# Patient Record
Sex: Female | Born: 1975 | Race: White | Hispanic: No | Marital: Married | Smoking: Current every day smoker
Health system: Southern US, Community
[De-identification: ages and names within clinical notes are randomized; demographics above are authoritative.]

## PROBLEM LIST (undated history)

## (undated) DIAGNOSIS — N200 Calculus of kidney: Secondary | ICD-10-CM

---

## 1998-03-14 ENCOUNTER — Other Ambulatory Visit: Admission: RE | Admit: 1998-03-14 | Discharge: 1998-03-14 | Payer: Self-pay | Admitting: *Deleted

## 1998-08-17 ENCOUNTER — Inpatient Hospital Stay (HOSPITAL_COMMUNITY): Admission: AD | Admit: 1998-08-17 | Discharge: 1998-08-20 | Payer: Self-pay

## 2000-05-19 ENCOUNTER — Other Ambulatory Visit: Admission: RE | Admit: 2000-05-19 | Discharge: 2000-05-19 | Payer: Self-pay | Admitting: Obstetrics and Gynecology

## 2002-12-19 ENCOUNTER — Other Ambulatory Visit: Admission: RE | Admit: 2002-12-19 | Discharge: 2002-12-19 | Payer: Self-pay

## 2003-09-03 ENCOUNTER — Other Ambulatory Visit: Admission: RE | Admit: 2003-09-03 | Discharge: 2003-09-03 | Payer: Self-pay | Admitting: Obstetrics and Gynecology

## 2011-09-02 ENCOUNTER — Other Ambulatory Visit (HOSPITAL_COMMUNITY): Payer: Self-pay | Admitting: MS"

## 2011-09-02 DIAGNOSIS — Q792 Exomphalos: Secondary | ICD-10-CM

## 2011-09-04 ENCOUNTER — Ambulatory Visit (HOSPITAL_COMMUNITY)
Admission: RE | Admit: 2011-09-04 | Discharge: 2011-09-04 | Disposition: A | Discharge status: Home / Self Care | Payer: 59 | Source: Ambulatory Visit | Attending: Nurse Practitioner | Admitting: Nurse Practitioner

## 2011-09-04 ENCOUNTER — Encounter (HOSPITAL_COMMUNITY): Payer: Self-pay

## 2011-09-04 VITALS — BP 110/66 | HR 104 | Wt 155.0 lb

## 2011-09-04 DIAGNOSIS — Q792 Exomphalos: Secondary | ICD-10-CM

## 2011-09-04 DIAGNOSIS — O352XX0 Maternal care for (suspected) hereditary disease in fetus, not applicable or unspecified: Secondary | ICD-10-CM | POA: Insufficient documentation

## 2011-09-04 DIAGNOSIS — O09529 Supervision of elderly multigravida, unspecified trimester: Secondary | ICD-10-CM | POA: Insufficient documentation

## 2011-09-04 DIAGNOSIS — O34219 Maternal care for unspecified type scar from previous cesarean delivery: Secondary | ICD-10-CM | POA: Insufficient documentation

## 2011-09-04 DIAGNOSIS — O358XX0 Maternal care for other (suspected) fetal abnormality and damage, not applicable or unspecified: Secondary | ICD-10-CM | POA: Insufficient documentation

## 2011-09-04 NOTE — Progress Notes (Addendum)
Genetic Counseling  High-Risk Gestation Note  Appointment Date:  09/04/2011 Referred By: Jeneen Montgomery  MD Date of Birth:  1976/02/08 Partner:  Malachy Mood  Estimated Date of Delivery: 03/06/12 Estimated Gestational Age: [redacted]w[redacted]d Attending: Rema Fendt, MD  [name] and her husband, Mr. Jillian Ibarra, were seen for genetic counseling because of a maternal age of 36 y.o. and previous ultrasound finding of omphalocele.   Ultrasound was performed at the time of today's visit and confirmed the finding of a large omphalocele at [redacted]w[redacted]d gestation. Complete ultrasound results reported separately.   Omphalocele occurs in ~1 in every 4000 births (M1:F5) and is defined as the protrusion of abdominal viscera through the umbilical ring, covered by membrane.  This defect is thought to arise from failure of lateral body migration and body wall closure or from the embryonic persistence of the body stalk.  We discussed that ~two thirds of all cases have associated anomalies, most commonly including: cardiac defects, neural tube defects, and cleft lip with or without palate.  We reviewed the common causes of omphaloceles, including sporadic, multifactorial, and genetic etiologies.  Specifically, we discussed that omphaloceles are associated with a 30-50% risk of fetal aneuploidy, complexes such as OEIS, and genetic syndromes including Beckwith-Wiedemann syndrome (BWS) and single gene conditions.  There are reports of familial nonsyndromic omphaloceles with both autosomal recessive and autosomal dominant inheritance reported.    They were counseled regarding maternal age and the association with risk for chromosome conditions due to nondisjunction with aging of the ova.   We reviewed chromosomes and nondisjunction. We specifically discussed Down syndrome (trisomy 21), trisomies 13 and 51, including the common features and prognoses of each. We reviewed the diagnostic options of CVS and amniocetesis  for prenatal diagnosis of chromosome conditions.  We discussed the benefits, limitations, and risks of each. We discussed another type of screening test, noninvasive prenatal testing (NIPT), which utilizes cell free fetal DNA found in the maternal circulation. This test is not diagnostic for chromosome conditions, but can provide information regarding the presence or absence of extra fetal DNA for chromosomes 13, 18 and 21. Thus, it would not identify or rule out all fetal aneuploidy. The reported detection rate is greater than 99% for Trisomy 21, greater than 97% for Trisomy 18, and is approximately 80% (8 out of 10) for Trisomy 13. The false positive rate is thought to be less than 0.1% for any of these conditions. We discussed that second trimester targeted ultrasound is available to further assess fetal anatomy. The couple understands that ultrasound cannot detect all birth defects or genetic conditions prenatally. Additionally, a postnatal medical genetics evaluation would be available to help determine whether there is an underlying genetic syndrome.  After careful consideration, [name] declined CVS, amniocentesis, and Harm[facility] (cell free fetal DNA testing) at the time of today's visit. The couple is further considering pursuing Harmony, and would possibly consider amniocentesis pending the results of that testing. Targeted ultrasound was planned for 10/02/11. The couple stated that they may pursue Harmony (cell free fetal DNA testing) at the time of this return appointment.   We discussed management and prognosis.  They understand that the overall prognosis depends upon the underlying etiology; however, if isolated and nonsyndromic, the prognosis is relatively good.  We reviewed the option of meeting with a pediatric surgeon to review the surgical approach and postnatal management.  In addition, we also discussed the importance of delivering in tertiary care center, to optimize care  of the  newborn.  [name] stated that she would like to plan to delivery with [facility]. We discussed that later in the pregnancy she would be referred for a prenatal surgical consult.  We also discussed the recommendation for fetal echocardiogram, given the increased association of omphaloceles with CHDs, which will also be scheduled at a later date. We provided the couple with written information on omphalocele. They were encouraged to call with additional questions or concerns.   Both family histories were reviewed and found to be contributory for a ventricular septal defect (VSD) in the patient's son with a previous partner. His VSD was repaired at age 67 and [age] years. He is currently 37 years old and reportedly healthy. Congenital heart defects occur in approximately 1% of pregnancies.  Congenital heart defects may occur due to multifactorial influences, chromosomal abnormalities, genetic syndromes or environmental exposures.  Isolated heart defects are generally multifactorial. Recurrence risk for isolated congenital heart defects in second degree relatives in the case of multifactorial inheritance is approximately 1-2%. Targeted ultrasound and fetal echocardiogram are available to the patient in the current pregnancy as previously discussed.   Additionally, [name] reported a maternal uncle with Down syndrome who passed away at a few months of age. His mother reportedly drank alcohol throughout the pregnancy. We discussed that 95% of cases of Down syndrome are not inherited and are the result of non-disjunction.  Three to 4% of cases of Down syndrome are the result of a translocation involving chromosome #21.  We discussed the option of chromosome analysis to determine if an individual is a carrier of a balanced translocation involving chromosome #21.  If an individual carries a balanced translocation involving chromosome #21, then the chance to have a baby with Down syndrome would be  greater than the maternal age-related risk. In the case that this uncle's features were due to prenatal alcohol exposure and not Down syndrome, recurrence risk would not be expected to be increased for relatives. In the case of a different underlying cause, recurrence risk estimate may change.  Without further information regarding the provided family history, an accurate genetic risk cannot be calculated. Further genetic counseling is warranted if more information is obtained.  [name] denied exposure to environmental toxins or chemical agents. She denied the use of alcohol, tobacco or street drugs. She denied significant viral illnesses during the course of her pregnancy. Her medical and surgical histories were contributory for kidney stones and disproportionate kidney size. She reported that she sees a nephrologist monthly.    I counseled this couple regarding the above risks and available options.  The approximate face-to-face time with the genetic counselor was 30 minutes.  Quinn Plowman, MS,  Certified [employer] 09/04/2011

## 2011-09-04 NOTE — Progress Notes (Signed)
Jillian Ibarra was seen for ultrasound appointment today.  Please see [facility] report for details.

## 2011-09-07 ENCOUNTER — Other Ambulatory Visit: Payer: Self-pay

## 2011-10-02 ENCOUNTER — Ambulatory Visit (HOSPITAL_COMMUNITY)
Admission: RE | Admit: 2011-10-02 | Discharge: 2011-10-02 | Disposition: A | Discharge status: Home / Self Care | Payer: 59 | Source: Ambulatory Visit | Attending: Nurse Practitioner | Admitting: Nurse Practitioner

## 2011-10-02 ENCOUNTER — Encounter (HOSPITAL_COMMUNITY): Payer: Self-pay

## 2011-10-02 VITALS — BP 107/70 | HR 106 | Wt 156.0 lb

## 2011-10-02 DIAGNOSIS — O352XX0 Maternal care for (suspected) hereditary disease in fetus, not applicable or unspecified: Secondary | ICD-10-CM | POA: Insufficient documentation

## 2011-10-02 DIAGNOSIS — Z1389 Encounter for screening for other disorder: Secondary | ICD-10-CM | POA: Insufficient documentation

## 2011-10-02 DIAGNOSIS — O34219 Maternal care for unspecified type scar from previous cesarean delivery: Secondary | ICD-10-CM | POA: Insufficient documentation

## 2011-10-02 DIAGNOSIS — O358XX0 Maternal care for other (suspected) fetal abnormality and damage, not applicable or unspecified: Secondary | ICD-10-CM | POA: Insufficient documentation

## 2011-10-02 DIAGNOSIS — Q792 Exomphalos: Secondary | ICD-10-CM

## 2011-10-02 DIAGNOSIS — Z363 Encounter for antenatal screening for malformations: Secondary | ICD-10-CM | POA: Insufficient documentation

## 2011-10-02 DIAGNOSIS — O09529 Supervision of elderly multigravida, unspecified trimester: Secondary | ICD-10-CM | POA: Insufficient documentation

## 2011-10-05 ENCOUNTER — Telehealth (HOSPITAL_COMMUNITY): Payer: Self-pay | Admitting: MS"

## 2011-10-05 ENCOUNTER — Telehealth (HOSPITAL_COMMUNITY): Payer: Self-pay | Admitting: Maternal and Fetal Medicine

## 2011-10-05 NOTE — Telephone Encounter (Signed)
[name] called. Stated that she and her partner had discussed and considered options over the weekend and have decided to terminate the pregnancy given the multiple birth defects present. We reviewed the options of induction of pregnancy versus dilation and evacuation procedure. Jillian Ibarra stated that she would like to proceed with D&E and would like our practice to facilitate the procedure. Jillian Ibarra stated that she has medicaid and insurance and inquired about insurance coverage. I reviewed that Medicaid will not cover the procedure but that she may call her insurance company directly to inquire about coverage. We will contact patient when appointments are arranged, tentatively scheduled for next week.

## 2011-10-05 NOTE — Telephone Encounter (Signed)
Ms. Hendon has decided to proceed with termination of pregnancy due to the multiple fetal anomalies present.  We discussed the risk, and alternatives of termination of pregnancy via induction of labor and D&E.  She wishes to have a D&E.  The [place] termination of pregnancy information was given to her over the phone today at 12:00 pm.    She will report to [facility] at 10:30 am on Tuesday 10/13/11 for laminaria placement.  She pre-anesthesia visit and D&E procedure are scheduled for 10/14/11.  She will report at 9:00 am.

## 2011-10-14 ENCOUNTER — Other Ambulatory Visit: Payer: Self-pay

## 2011-10-29 ENCOUNTER — Telehealth (HOSPITAL_COMMUNITY): Payer: Self-pay | Admitting: MS"

## 2011-10-29 NOTE — Telephone Encounter (Signed)
Called Jillian Ibarra to discuss her Harmony, cell free fetal DNA testing.  We reviewed that a result was not able to be obtained from the sample. The underlying reason that a result was not obtained was reportedly due to high variation in cell free DNA counts. [name] stated that she is doing OK. She inquired if testing was performed on placenta at the time of her D&E. I discussed that I was unable to see if testing had been performed but that the records may be in the [facility] system. I plan to discuss with Dr. Otho Perl next week and call patient back.   Quinn Plowman, MS Patent attorney

## 2011-11-03 ENCOUNTER — Ambulatory Visit (HOSPITAL_COMMUNITY): Payer: 59

## 2011-11-05 ENCOUNTER — Telehealth (HOSPITAL_COMMUNITY): Payer: Self-pay | Admitting: MS"

## 2011-11-05 NOTE — Telephone Encounter (Signed)
Called 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 We reviewed the common features, including what was visualized on prenatal ultrasound in the patient's pregnancy and the poor prognosis of Trisomy 18. We reviewed nondisjunction and that Trisomy 18 occurs sporadically. Once a couple has had one pregnancy with Trisomy 31, the risk of any extra chromosome condition in a future pregnancy is the greatest of the following figures: either 1% or a woman's age-related risk for a chromosome condition. Given that [name] is currently 36 y.o., we reviewed that the risk for fetal aneuploidy in a future pregnancy would be the patient's age related risk, which will change over time. We reviewed that all screening and diagnostic options for fetal chromosome conditions that were available to the patient in her recent pregnancy would also be available to her in a future pregnancy including CVS, amniocentesis, cell free fetal DNA testing, and screening options. We reviewed that we would be available to review these options again prior to or during a future pregnancy.   We reviewed the options of bereavement support, including local resource group [employer], pregnancy and infant loss support. [name] stated that she was familiar with this organization.   Additionally, we discussed that Dr. Otho Perl had been contacted about completing forms for a request for disability. [name] stated that was regarding her request for FMLA following the D&E and that this was already resolved.   We encouraged the patient to contact us with additional questions or concerns.   Quinn Plowman, MS Certified Genetic Counselor 12:12 PM

## 2014-05-28 ENCOUNTER — Encounter (HOSPITAL_COMMUNITY): Payer: Self-pay

## 2018-11-08 ENCOUNTER — Encounter (HOSPITAL_COMMUNITY): Payer: Self-pay | Admitting: Emergency Medicine

## 2018-11-08 ENCOUNTER — Other Ambulatory Visit: Payer: Self-pay

## 2018-11-08 ENCOUNTER — Ambulatory Visit (HOSPITAL_COMMUNITY)
Admission: EM | Admit: 2018-11-08 | Discharge: 2018-11-08 | Disposition: A | Discharge status: Home / Self Care | Payer: Self-pay | Attending: Family Medicine | Admitting: Family Medicine

## 2018-11-08 DIAGNOSIS — N390 Urinary tract infection, site not specified: Secondary | ICD-10-CM

## 2018-11-08 HISTORY — DX: Calculus of kidney: N20.0

## 2018-11-08 LAB — POCT PREGNANCY, URINE: Preg Test, Ur: NEGATIVE

## 2018-11-08 MED ORDER — NITROFURANTOIN MONOHYD MACRO 100 MG PO CAPS: 100.0000 mg | ORAL_CAPSULE | Freq: Two times a day (BID) | ORAL | 0 refills | Status: AC

## 2018-11-08 NOTE — ED Notes (Signed)
UA ran specimen rejected due to quality.

## 2018-11-08 NOTE — ED Triage Notes (Signed)
Pt here for UTI sx x 4 days 

## 2018-11-08 NOTE — ED Provider Notes (Signed)
[facility]    CSN: [id number] Arrival date & time: 11/08/18  1024     History   Chief Complaint Chief Complaint  Patient presents with  . Urinary Tract Infection    HPI Jillian DailyJennifer R Wetherington is a 43 y.o. female  history of kidney stones presenting today for evaluation of possible UTI.  Patient states that over the past 4 days she has noticed that her urine has become cloudy, yellow and had an odor to it.  She is also developed some left lower back pain.  She denies dysuria, but does have increased frequency.  Has been trying to drink cranberry juice.  Denies fever, nausea or vomiting.  Denies vaginal discharge, itching or irritation.  HPI  Past Medical History:  Diagnosis Date  . Kidney stones     There are no active problems to display for this patient.   History reviewed. No pertinent surgical history.  OB History   No obstetric history on file.      Home Medications    Prior to Admission medications   Medication Sig Start Date End Date Taking? Authorizing Provider  nitrofurantoin, macrocrystal-monohydrate, (MACROBID) 100 MG capsule Take 1 capsule (100 mg total) by mouth 2 (two) times Ibarra for 5 days. 11/08/18 11/13/18  Chistian Kasler C, PA-C  PRENATAL VITAMINS PO Take by mouth.    [provider]    Family History Family History  Problem Relation Age of Onset  . Down syndrome Maternal Uncle   . Congenital heart disease Son        VSD; half-sibling to current pregnancy    Social History Social History   Tobacco Use  . Smoking status: Current Every Day Smoker  . Smokeless tobacco: Never Used  Substance Use Topics  . Alcohol use: Not Currently  . Drug use: Never     Allergies   Sulfa antibiotics   Review of Systems Review of Systems  Constitutional: Negative for fever.  Respiratory: Negative for shortness of breath.   Cardiovascular: Negative for chest pain.  Gastrointestinal: Negative for abdominal pain, diarrhea, nausea and  vomiting.  Genitourinary: Positive for frequency. Negative for dysuria, flank pain, genital sores, hematuria, menstrual problem, vaginal bleeding, vaginal discharge and vaginal pain.  Musculoskeletal: Positive for back pain.  Skin: Negative for rash.  Neurological: Negative for dizziness, light-headedness and headaches.     Physical Exam Triage Vital Signs ED Triage Vitals  Enc Vitals Group     BP 11/08/18 1048 100/71     Pulse Rate 11/08/18 1048 91     Resp 11/08/18 1048 18     Temp 11/08/18 1048 97.6 F (36.4 C)     Temp Source 11/08/18 1048 Oral     SpO2 11/08/18 1048 97 %     Weight --      Height --      Head Circumference --      Peak Flow --      Pain Score 11/08/18 1050 4     Pain Loc --      Pain Edu? --      Excl. in GC? --    No data found.  Updated Vital Signs BP 100/71 (BP Location: Right Arm)   Pulse 91   Temp 97.6 F (36.4 C) (Oral)   Resp 18   SpO2 97%   Breastfeeding Unknown   Visual Acuity Right Eye Distance:   Left Eye Distance:   Bilateral Distance:    Right Eye Near:   Left  Eye Near:    Bilateral Near:     Physical Exam Vitals signs and nursing note reviewed.  Constitutional:      General: She is not in acute distress.    Appearance: She is well-developed.  HENT:     Head: Normocephalic and atraumatic.  Eyes:     Conjunctiva/sclera: Conjunctivae normal.  Neck:     Musculoskeletal: Neck supple.  Cardiovascular:     Rate and Rhythm: Normal rate and regular rhythm.     Heart sounds: No murmur.  Pulmonary:     Effort: Pulmonary effort is normal. No respiratory distress.     Breath sounds: Normal breath sounds.     Comments: Breathing comfortably at rest, CTABL, no wheezing, rales or other adventitious sounds auscultated Abdominal:     Palpations: Abdomen is soft.     Tenderness: There is abdominal tenderness.     Comments: Mild tenderness to upper abdomen, no focal tenderness, negative rebound, nontender to bilateral lower  quadrants No CVA tenderness  Skin:    General: Skin is warm and dry.  Neurological:     Mental Status: She is alert.      UC Treatments / Results  Labs (all labs ordered are listed, but only abnormal results are displayed) Labs Reviewed  URINE CULTURE  POC URINE PREG, ED  POCT PREGNANCY, URINE    EKG None  Radiology No results found.  Procedures Procedures (including critical care time)  Medications Ordered in UC Medications - No data to display  Initial Impression / Assessment and Plan / UC Course  I have reviewed the triage vital signs and the nursing notes.  Pertinent labs & imaging results that were available during my care of the patient were reviewed by me and considered in my medical decision making (see chart for details).     UA unable to be run by machine in clinic, will send for culture.  Pregnancy negative.  Will treat symptomatically based off symptoms for UTI today, initiating on Macrobid twice Ibarra x5 days.  Discussed cannot rule out kidney stone today, symptoms seem more UTI related but advised to continue to monitor pain in lower back and take anti-inflammatories to help with this in case underlying stone.  Follow-up if symptoms worsening or not resolving with antibiotic.Discussed strict return precautions. Patient verbalized understanding and is agreeable with plan.  Final Clinical Impressions(s) / UC Diagnoses   Final diagnoses:  Lower urinary tract infectious disease     Discharge Instructions     Please begin taking Macrobid twice Ibarra for the next 5 days. be sure to take full course. Stay hydrated- urine should be pale yellow to clear. May continue azo for relief of burning while infection is being cleared.   Please return or follow up with your primary provider if symptoms not improving with treatment. Please return sooner if you have worsening of symptoms or develop fever, nausea, vomiting, abdominal pain, back pain, lightheadedness,  dizziness.    ED Prescriptions    Medication Sig Dispense Auth. Provider   nitrofurantoin, macrocrystal-monohydrate, (MACROBID) 100 MG capsule Take 1 capsule (100 mg total) by mouth 2 (two) times Ibarra for 5 days. 10 capsule Lorilyn Laitinen C, PA-C     Controlled Substance Prescriptions [place] Controlled Substance Registry consulted? Not Applicable   Lew Dawes, [place] 11/08/18 1157

## 2018-11-08 NOTE — Discharge Instructions (Signed)
Please begin taking Macrobid twice daily for the next 5 days. be sure to take full course. Stay hydrated- urine should be pale yellow to clear. May continue azo for relief of burning while infection is being cleared.   Please return or follow up with your primary provider if symptoms not improving with treatment. Please return sooner if you have worsening of symptoms or develop fever, nausea, vomiting, abdominal pain, back pain, lightheadedness, dizziness.

## 2018-11-11 ENCOUNTER — Telehealth (HOSPITAL_COMMUNITY): Payer: Self-pay | Admitting: Emergency Medicine

## 2018-11-11 LAB — URINE CULTURE: Culture: >=100000 — AB

## 2018-11-11 NOTE — Telephone Encounter (Signed)
Attempted to reach patient. No answer at this time.   

## 2018-12-20 ENCOUNTER — Emergency Department (HOSPITAL_COMMUNITY)
Admission: EM | Admit: 2018-12-20 | Discharge: 2018-12-20 | Disposition: A | Discharge status: Home / Self Care | Payer: Medicaid Other | Attending: Emergency Medicine | Admitting: Emergency Medicine

## 2018-12-20 DIAGNOSIS — R112 Nausea with vomiting, unspecified: Secondary | ICD-10-CM | POA: Diagnosis present

## 2018-12-20 DIAGNOSIS — N12 Tubulo-interstitial nephritis, not specified as acute or chronic: Secondary | ICD-10-CM | POA: Diagnosis not present

## 2018-12-20 DIAGNOSIS — F17200 Nicotine dependence, unspecified, uncomplicated: Secondary | ICD-10-CM | POA: Insufficient documentation

## 2018-12-20 DIAGNOSIS — Z79899 Other long term (current) drug therapy: Secondary | ICD-10-CM | POA: Insufficient documentation

## 2018-12-20 LAB — COMPREHENSIVE METABOLIC PANEL
ALT: 20 U/L (ref 0–44)
AST: 19 U/L (ref 15–41)
Albumin: 3.1 g/dL — ABNORMAL LOW (ref 3.5–5.0)
Alkaline Phosphatase: 90 U/L (ref 38–126)
Anion gap: 10 (ref 5–15)
BUN: 15 mg/dL (ref 6–20)
CO2: 21 mmol/L — ABNORMAL LOW (ref 22–32)
Calcium: 8.8 mg/dL — ABNORMAL LOW (ref 8.9–10.3)
Chloride: 104 mmol/L (ref 98–111)
Creatinine, Ser: 0.9 mg/dL (ref 0.44–1.00)
GFR calc Af Amer: >60 mL/min (ref >60)
GFR calc non Af Amer: >60 mL/min (ref >60)
Glucose, Bld: 183 mg/dL — ABNORMAL HIGH (ref 70–99)
Potassium: 3.5 mmol/L (ref 3.5–5.1)
Sodium: 135 mmol/L (ref 135–145)
Total Bilirubin: 1.1 mg/dL (ref 0.3–1.2)
Total Protein: 7.6 g/dL (ref 6.5–8.1)

## 2018-12-20 LAB — CBC
HCT: 31.5 % — ABNORMAL LOW (ref 36.0–46.0)
Hemoglobin: 10.6 g/dL — ABNORMAL LOW (ref 12.0–15.0)
MCH: 29.9 pg (ref 26.0–34.0)
MCHC: 33.7 g/dL (ref 30.0–36.0)
MCV: 89 fL (ref 80.0–100.0)
Platelets: 351 10*3/uL (ref 150–400)
RBC: 3.54 MIL/uL — ABNORMAL LOW (ref 3.87–5.11)
RDW: 13.9 % (ref 11.5–15.5)
WBC: 20.5 10*3/uL — ABNORMAL HIGH (ref 4.0–10.5)
nRBC: 0 % (ref 0.0–0.2)

## 2018-12-20 LAB — URINALYSIS, ROUTINE W REFLEX MICROSCOPIC
Bilirubin Urine: NEGATIVE
Glucose, UA: NEGATIVE mg/dL
Ketones, ur: 5 mg/dL — AB
Nitrite: NEGATIVE
Protein, ur: 100 mg/dL — AB
Specific Gravity, Urine: 1.014 (ref 1.005–1.030)
WBC, UA: >50 WBC/hpf — ABNORMAL HIGH (ref 0–5)
pH: 6 (ref 5.0–8.0)

## 2018-12-20 LAB — I-STAT BETA HCG BLOOD, ED (MC, WL, AP ONLY): I-stat hCG, quantitative: 10.9 m[IU]/mL — ABNORMAL HIGH (ref <5)

## 2018-12-20 LAB — LIPASE, BLOOD: Lipase: 19 U/L (ref 11–51)

## 2018-12-20 LAB — HCG, QUANTITATIVE, PREGNANCY: hCG, Beta Chain, Quant, S: 6 m[IU]/mL — ABNORMAL HIGH (ref <5)

## 2018-12-20 MED ORDER — ONDANSETRON 8 MG PO TBDP: 8.0000 mg | ORAL_TABLET | Freq: Three times a day (TID) | ORAL | 0 refills | Status: AC | PRN

## 2018-12-20 MED ORDER — SODIUM CHLORIDE 0.9 % IV BOLUS
1000.0000 mL | Freq: Once | INTRAVENOUS | Status: AC
  Administered 2018-12-20: 1000 mL via INTRAVENOUS

## 2018-12-20 MED ORDER — HYDROCODONE-ACETAMINOPHEN 5-325 MG PO TABS: 1.0000 | ORAL_TABLET | ORAL | 0 refills | Status: AC | PRN

## 2018-12-20 MED ORDER — ONDANSETRON HCL 4 MG/2ML IJ SOLN
4.0000 mg | Freq: Once | INTRAMUSCULAR | Status: AC
  Administered 2018-12-20: 4 mg via INTRAVENOUS
  Filled 2018-12-20: qty 2

## 2018-12-20 MED ORDER — ONDANSETRON 8 MG PO TBDP: 8.0000 mg | ORAL_TABLET | Freq: Three times a day (TID) | ORAL | 0 refills | Status: DC | PRN

## 2018-12-20 MED ORDER — METOCLOPRAMIDE HCL 5 MG/ML IJ SOLN
10.0000 mg | Freq: Once | INTRAMUSCULAR | Status: AC
  Administered 2018-12-20: 14:00:00 10 mg via INTRAVENOUS
  Filled 2018-12-20: qty 2

## 2018-12-20 MED ORDER — CEPHALEXIN 500 MG PO CAPS: 500.0000 mg | ORAL_CAPSULE | Freq: Three times a day (TID) | ORAL | 0 refills | Status: AC

## 2018-12-20 MED ORDER — SODIUM CHLORIDE 0.9 % IV SOLN
1.0000 g | Freq: Once | INTRAVENOUS | Status: AC
  Administered 2018-12-20: 17:00:00 1 g via INTRAVENOUS
  Filled 2018-12-20: qty 10

## 2018-12-20 NOTE — ED Notes (Signed)
Bed: JO83 Expected date:  Expected time:  Means of arrival:  Comments: EMS flu like symptoms

## 2018-12-20 NOTE — ED Notes (Signed)
Pt c/o dizziness, nausea returned when ambulated. MD made aware.

## 2018-12-20 NOTE — ED Triage Notes (Signed)
Pt BIBA PTAR c/o headache 7/10, n/v starting last night.  Pt repots taking advil last night with some relief.    Pt reports fever last night 104.0- took advil.

## 2018-12-20 NOTE — ED Provider Notes (Signed)
[facility]-EMERGENCY DEPT Provider Note   CSN: [id number] Arrival date & time: 12/20/18  1046    History   Chief Complaint Chief Complaint  Patient presents with  . Nausea  . Emesis  . Dizziness    HPI Jillian Ibarra is a 43 y.o. female.     HPI 43 year old female presents the emergency department complaints of generalized headache as well as nausea vomiting and diarrhea since last night.  She reports no blood in her vomit or blood in her stool.  She feels slightly weak.  She denies cough or shortness of breath.  No chest pain.  No known contact with COVID-19 the patients or patients under investigation for COVID-19   Past Medical History:  Diagnosis Date  . Kidney stones     There are no active problems to display for this patient.   No past surgical history on file.   OB History   No obstetric history on file.      Home Medications    Prior to Admission medications   Medication Sig Start Date End Date Taking? Authorizing Provider  PRENATAL VITAMINS PO Take by mouth.    [provider]    Family History Family History  Problem Relation Age of Onset  . Down syndrome Maternal Uncle   . Congenital heart disease Son        VSD; half-sibling to current pregnancy    Social History Social History   Tobacco Use  . Smoking status: Current Every Day Smoker  . Smokeless tobacco: Never Used  Substance Use Topics  . Alcohol use: Not Currently  . Drug use: Never     Allergies   Sulfa antibiotics   Review of Systems Review of Systems  All other systems reviewed and are negative.    Physical Exam Updated Vital Signs BP 111/79 (BP Location: Left Arm)   Pulse 81   Temp 98.1 F (36.7 C) (Oral)   SpO2 98%   Physical Exam Vitals signs and nursing note reviewed.  Constitutional:      General: She is not in acute distress.    Appearance: She is well-developed.  HENT:     Head: Normocephalic and atraumatic.   Neck:     Musculoskeletal: Normal range of motion.  Cardiovascular:     Rate and Rhythm: Normal rate and regular rhythm.     Heart sounds: Normal heart sounds.  Pulmonary:     Effort: Pulmonary effort is normal.     Breath sounds: Normal breath sounds.  Abdominal:     General: There is no distension.     Palpations: Abdomen is soft.     Tenderness: There is no abdominal tenderness.  Musculoskeletal: Normal range of motion.  Skin:    General: Skin is warm and dry.  Neurological:     Mental Status: She is alert and oriented to person, place, and time.  Psychiatric:        Judgment: Judgment normal.      ED Treatments / Results  Labs (all labs ordered are listed, but only abnormal results are displayed) Labs Reviewed  CBC - Abnormal; Notable for the following components:      Result Value   WBC 20.5 (*)    RBC 3.54 (*)    Hemoglobin 10.6 (*)    HCT 31.5 (*)    All other components within normal limits  COMPREHENSIVE METABOLIC PANEL - Abnormal; Notable for the following components:   CO2 21 (*)  Glucose, Bld 183 (*)    Calcium 8.8 (*)    Albumin 3.1 (*)    All other components within normal limits  URINALYSIS, ROUTINE W REFLEX MICROSCOPIC - Abnormal; Notable for the following components:   APPearance CLOUDY (*)    Hgb urine dipstick SMALL (*)    Ketones, ur 5 (*)    Protein, ur 100 (*)    Leukocytes,Ua LARGE (*)    WBC, UA >50 (*)    Bacteria, UA MANY (*)    All other components within normal limits  HCG, QUANTITATIVE, PREGNANCY - Abnormal; Notable for the following components:   hCG, Beta Chain, Quant, S 6 (*)    All other components within normal limits  I-STAT BETA HCG BLOOD, ED ([facility], [facility], [facility] ONLY) - Abnormal; Notable for the following components:   I-stat hCG, quantitative 10.9 (*)    All other components within normal limits  LIPASE, BLOOD    EKG None  Radiology No results found.  Procedures Procedures (including critical care time)  Medications  Ordered in ED Medications  sodium chloride 0.9 % bolus 1,000 mL (0 mLs Intravenous Stopped 12/20/18 1228)  ondansetron (ZOFRAN) injection 4 mg (4 mg Intravenous Given 12/20/18 1128)  sodium chloride 0.9 % bolus 1,000 mL (0 mLs Intravenous Stopped 12/20/18 1525)  metoCLOPramide (REGLAN) injection 10 mg (10 mg Intravenous Given 12/20/18 1418)  cefTRIAXone (ROCEPHIN) 1 g in sodium chloride 0.9 % 100 mL IVPB (0 g Intravenous Stopped 12/20/18 1749)     Initial Impression / Assessment and Plan / ED Course  I have reviewed the triage vital signs and the nursing notes.  Pertinent labs & imaging results that were available during my care of the patient were reviewed by me and considered in my medical decision making (see chart for details).       Symptoms improved in the emergency department.  Suspected pyelonephritis.  No signs to suggest COVID-19.  IV Rocephin.  Home with antibiotics.  Urine culture sent.  Primary care follow-up.  Patient understands return the emergency department for new or worsening symptoms   ALLEAH Ibarra was evaluated in Emergency Department on 12/20/2018 for the symptoms described in the history of present illness. She was evaluated in the context of the global COVID-19 pandemic, which necessitated consideration that the patient might be at risk for infection with the SARS-CoV-2 virus that causes COVID-19. Institutional protocols and algorithms that pertain to the evaluation of patients at risk for COVID-19 are in a state of rapid change based on information released by regulatory bodies including the CDC and federal and state organizations. These policies and algorithms were followed during the patient's care in the ED.   Final Clinical Impressions(s) / ED Diagnoses   Final diagnoses:  Pyelonephritis    ED Discharge Orders         Ordered    cephALEXin (KEFLEX) 500 MG capsule  3 times daily     12/20/18 1654    ondansetron (ZOFRAN ODT) 8 MG disintegrating tablet   Every 8 hours PRN,   Status:  Discontinued     12/20/18 1654    HYDROcodone-acetaminophen (NORCO/VICODIN) 5-325 MG tablet  Every 4 hours PRN     12/20/18 1654    ondansetron (ZOFRAN ODT) 8 MG disintegrating tablet  Every 8 hours PRN     12/20/18 1655           [name], MD 12/25/18 1158

## 2018-12-20 NOTE — ED Notes (Signed)
Pt ambulatory with assistance to bathroom

## 2020-10-01 ENCOUNTER — Ambulatory Visit (HOSPITAL_COMMUNITY): Payer: Medicaid Other | Admitting: Professional

## 2020-10-01 ENCOUNTER — Other Ambulatory Visit: Payer: Self-pay

## 2020-10-15 ENCOUNTER — Ambulatory Visit (INDEPENDENT_AMBULATORY_CARE_PROVIDER_SITE_OTHER): Payer: Medicaid Other | Admitting: Professional

## 2020-10-15 ENCOUNTER — Other Ambulatory Visit: Payer: Self-pay

## 2020-10-15 DIAGNOSIS — F411 Generalized anxiety disorder: Secondary | ICD-10-CM

## 2020-10-15 DIAGNOSIS — F331 Major depressive disorder, recurrent, moderate: Secondary | ICD-10-CM | POA: Diagnosis not present
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
## 2020-10-23 ENCOUNTER — Other Ambulatory Visit: Payer: Self-pay

## 2020-10-23 ENCOUNTER — Encounter (HOSPITAL_COMMUNITY): Payer: Self-pay | Admitting: Physician Assistant

## 2020-10-23 ENCOUNTER — Telehealth (INDEPENDENT_AMBULATORY_CARE_PROVIDER_SITE_OTHER): Payer: Medicaid Other | Admitting: Physician Assistant

## 2020-10-23 DIAGNOSIS — F5105 Insomnia due to other mental disorder: Secondary | ICD-10-CM

## 2020-10-23 DIAGNOSIS — F411 Generalized anxiety disorder: Secondary | ICD-10-CM | POA: Diagnosis not present

## 2020-10-23 DIAGNOSIS — F332 Major depressive disorder, recurrent severe without psychotic features: Secondary | ICD-10-CM

## 2020-10-23 DIAGNOSIS — F99 Mental disorder, not otherwise specified: Secondary | ICD-10-CM

## 2020-10-23 MED ORDER — VENLAFAXINE HCL ER 37.5 MG PO CP24: 37.5000 mg | ORAL_CAPSULE | Freq: Every day | ORAL | 1 refills | Status: DC

## 2020-10-23 MED ORDER — HYDROXYZINE HCL 10 MG PO TABS: 10.0000 mg | ORAL_TABLET | Freq: Three times a day (TID) | ORAL | 1 refills | Status: AC | PRN

## 2020-10-23 MED ORDER — TRAZODONE HCL 50 MG PO TABS: 50.0000 mg | ORAL_TABLET | Freq: Every day | ORAL | 1 refills | Status: DC
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
## 2020-11-04 ENCOUNTER — Telehealth (HOSPITAL_COMMUNITY): Payer: Medicaid Other

## 2020-12-03 ENCOUNTER — Other Ambulatory Visit: Payer: Self-pay

## 2020-12-03 ENCOUNTER — Encounter (INDEPENDENT_AMBULATORY_CARE_PROVIDER_SITE_OTHER): Payer: Self-pay

## 2020-12-03 ENCOUNTER — Telehealth (INDEPENDENT_AMBULATORY_CARE_PROVIDER_SITE_OTHER): Payer: Medicaid Other | Admitting: Physician Assistant

## 2020-12-03 ENCOUNTER — Encounter (HOSPITAL_COMMUNITY): Payer: Self-pay | Admitting: Physician Assistant

## 2020-12-03 DIAGNOSIS — F99 Mental disorder, not otherwise specified: Secondary | ICD-10-CM

## 2020-12-03 DIAGNOSIS — F411 Generalized anxiety disorder: Secondary | ICD-10-CM | POA: Diagnosis not present

## 2020-12-03 DIAGNOSIS — F4310 Post-traumatic stress disorder, unspecified: Secondary | ICD-10-CM

## 2020-12-03 DIAGNOSIS — F331 Major depressive disorder, recurrent, moderate: Secondary | ICD-10-CM

## 2020-12-03 DIAGNOSIS — F410 Panic disorder [episodic paroxysmal anxiety] without agoraphobia: Secondary | ICD-10-CM

## 2020-12-03 DIAGNOSIS — F5105 Insomnia due to other mental disorder: Secondary | ICD-10-CM

## 2020-12-03 MED ORDER — TRAZODONE HCL 100 MG PO TABS: 100.0000 mg | ORAL_TABLET | Freq: Every day | ORAL | 1 refills | Status: DC

## 2020-12-03 MED ORDER — VENLAFAXINE HCL ER 75 MG PO CP24: 75.0000 mg | ORAL_CAPSULE | Freq: Every day | ORAL | 1 refills | Status: DC
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
## 2021-02-05 ENCOUNTER — Telehealth (INDEPENDENT_AMBULATORY_CARE_PROVIDER_SITE_OTHER): Payer: Medicaid Other | Admitting: Physician Assistant

## 2021-02-05 ENCOUNTER — Other Ambulatory Visit: Payer: Self-pay

## 2021-02-05 DIAGNOSIS — F4310 Post-traumatic stress disorder, unspecified: Secondary | ICD-10-CM | POA: Diagnosis not present

## 2021-02-05 DIAGNOSIS — F411 Generalized anxiety disorder: Secondary | ICD-10-CM | POA: Diagnosis not present

## 2021-02-05 DIAGNOSIS — F331 Major depressive disorder, recurrent, moderate: Secondary | ICD-10-CM | POA: Diagnosis not present

## 2021-02-05 DIAGNOSIS — F410 Panic disorder [episodic paroxysmal anxiety] without agoraphobia: Secondary | ICD-10-CM | POA: Diagnosis not present

## 2021-02-05 MED ORDER — MIRTAZAPINE 7.5 MG PO TABS: 7.5000 mg | ORAL_TABLET | Freq: Every day | ORAL | 1 refills | Status: AC

## 2021-02-05 MED ORDER — VENLAFAXINE HCL ER 75 MG PO CP24: 75.0000 mg | ORAL_CAPSULE | Freq: Every day | ORAL | 1 refills | Status: AC

## 2021-02-05 NOTE — Progress Notes (Signed)
BH MD/PA/NP OP Progress Note  Virtual Visit via Video Note  I connected with Jillian Ibarra on 02/05/21 at  3:00 PM EDT by a video enabled telemedicine application and verified that I am speaking with the correct person using two identifiers.  Location: Patient: Home Provider: Clinic   I discussed the limitations of evaluation and management by telemedicine and the availability of in person appointments. The patient expressed understanding and agreed to proceed.  Follow Up Instructions:   I discussed the assessment and treatment plan with the patient. The patient was provided an opportunity to ask questions and all were answered. The patient agreed with the plan and demonstrated an understanding of the instructions.   The patient was advised to call back or seek an in-person evaluation if the symptoms worsen or if the condition fails to improve as anticipated.  I provided 20 minutes of non-face-to-face time during this encounter.  Meta HatchetUchenna E Jonaven Hilgers, PA   02/05/2021 3:39 PM Jillian Ibarra  MRN:  [id number]  Chief Complaint: Follow up and medication management  HPI:   Jillian Ibarra is a 45 year old female with a past psychiatric history significant for major depressive disorder, generalized anxiety disorder, insomnia, and panic disorder [facility] video visit for follow-up and medication management.  Patient is currently being managed on the following medications:  Venlafaxine XR (Effexor XR) 75 mg 24-hour tablet daily Hydroxyzine 10 mg 3 times daily as needed Trazodone 100 mg at bedtime  Patient expresses that her mood and anxiety have been better managed since adjusting her dosage of venlafaxine.  Patient still endorses the following depressive symptoms: decreased energy, depressed mood, and irritability.  Patient reports that her anxiety has been much more tolerable when taking her new dosage of  venlafaxine in conjunction with hydroxyzine.  Patient expresses that she is still having issues with her sleep and has not noticed any improvements in her sleep patterns through the use of trazodone.  A PHQ-9 screen was performed with the patient scoring an 18.  A GAD-7 screen was also performed with the patient scoring a 19.  Patient is alert and oriented x4, calm, cooperative, and fully engaged in conversation during the encounter.  Patient denies suicidal or homicidal ideations.  She further denies auditory or visual hallucinations and does not appear to be responding to internal/external stimuli.  Patient endorses poor sleep and is interested in discontinuing her trazodone.  Patient endorses good appetite and eats on average 2 meals per day.  Patient denies alcohol consumption and illicit drug use.  Patient endorses tobacco use and smokes roughly a pack per day.  Visit Diagnosis:    ICD-10-CM   1. Generalized anxiety disorder  F41.1 venlafaxine XR (EFFEXOR XR) 75 MG 24 hr capsule    mirtazapine (REMERON) 7.5 MG tablet    2. Major depressive disorder, recurrent episode, moderate (HCC)  F33.1 venlafaxine XR (EFFEXOR XR) 75 MG 24 hr capsule    mirtazapine (REMERON) 7.5 MG tablet    3. PTSD (post-traumatic stress disorder)  F43.10 venlafaxine XR (EFFEXOR XR) 75 MG 24 hr capsule    4. Panic disorder  F41.0 venlafaxine XR (EFFEXOR XR) 75 MG 24 hr capsule      Past Psychiatric History:  Major depressive disorder Generalized anxiety disorder Insomnia PTSD Panic disorder  Past Medical History:  Past Medical History:  Diagnosis Date   Kidney stones    History reviewed. No pertinent surgical history.  Family Psychiatric  History:  Patient reports most members in her family have major depressive disorder or bipolar disorder. She also reports that anxiety runs in her family as well. Patient endorses a family history of alcohol abuse  Family History:  Family History  Problem Relation Age of  Onset   Down syndrome Maternal Uncle    Congenital heart disease Son        VSD; half-sibling to current pregnancy    Social History:  Social History   Socioeconomic History   Marital status: Married    Spouse name: Not on file   Number of children: Not on file   Years of education: Not on file   Highest education level: Not on file  Occupational History   Not on file  Tobacco Use   Smoking status: Every Day   Smokeless tobacco: Never  Substance and Sexual Activity   Alcohol use: Not Currently   Drug use: Never   Sexual activity: Not on file  Other Topics Concern   Not on file  Social History Narrative   Not on file   Social Determinants of Health   Financial Resource Strain: Not on file  Food Insecurity: Not on file  Transportation Needs: Not on file  Physical Activity: Not on file  Stress: Not on file  Social Connections: Not on file    Allergies:  Allergies  Allergen Reactions   Sulfa Antibiotics Rash    Metabolic Disorder Labs: No results found for: HGBA1C, MPG No results found for: PROLACTIN No results found for: CHOL, TRIG, HDL, CHOLHDL, VLDL, LDLCALC No results found for: TSH  Therapeutic Level Labs: No results found for: LITHIUM No results found for: VALPROATE No components found for:  CBMZ  Current Medications: Current Outpatient Medications  Medication Sig Dispense Refill   mirtazapine (REMERON) 7.5 MG tablet Take 1 tablet (7.5 mg total) by mouth at bedtime. 30 tablet 1   cephALEXin (KEFLEX) 500 MG capsule Take 1 capsule (500 mg total) by mouth 3 (three) times daily. 21 capsule 0   fluticasone (FLONASE) 50 MCG/ACT nasal spray Place 2 sprays into both nostrils daily as needed for allergies or rhinitis.     HYDROcodone-acetaminophen (NORCO/VICODIN) 5-325 MG tablet Take 1 tablet by mouth every 4 (four) hours as needed for moderate pain. 10 tablet 0   hydrOXYzine (ATARAX/VISTARIL) 10 MG tablet Take 1 tablet (10 mg total) by mouth 3 (three) times  daily as needed for anxiety. 90 tablet 1   ondansetron (ZOFRAN ODT) 8 MG disintegrating tablet Take 1 tablet (8 mg total) by mouth every 8 (eight) hours as needed for nausea or vomiting. 10 tablet 0   venlafaxine XR (EFFEXOR XR) 75 MG 24 hr capsule Take 1 capsule (75 mg total) by mouth daily. 30 capsule 1   No current facility-administered medications for this visit.     Musculoskeletal: Strength & Muscle Tone: Unable to assess due to telemedicine visit Gait & Station: Unable to assess due to telemedicine visit Patient leans: Unable to assess due to telemedicine visit  Psychiatric Specialty Exam: Review of Systems  Psychiatric/Behavioral:  Positive for sleep disturbance. Negative for decreased concentration, dysphoric mood, hallucinations, self-injury and suicidal ideas. The patient is nervous/anxious. The patient is not hyperactive.    unknown if currently breastfeeding.There is no height or weight on file to calculate BMI.  General Appearance: Well Groomed  Eye Contact:  Good  Speech:  Clear and Coherent and Normal Rate  Volume:  Normal  Mood:  Anxious and Depressed  Affect:  Congruent and Depressed  Thought Process:  Coherent, Goal Directed, and Descriptions of Associations: Intact  Orientation:  Full (Time, Place, and Person)  Thought Content: WDL   Suicidal Thoughts:  No  Homicidal Thoughts:  No  Memory:  Immediate;   Good Recent;   Good Remote;   Good  Judgement:  Good  Insight:  Good  Psychomotor Activity:  Normal  Concentration:  Concentration: Good and Attention Span: Good  Recall:  Good  Fund of Knowledge: Good  Language: Good  Akathisia:  NA  Handed:  Right  AIMS (if indicated): not done  Assets:  Communication Skills Desire for Improvement Housing Vocational/Educational  ADL's:  Intact  Cognition: WNL  Sleep:  Fair   Screenings: GAD-7    Flowsheet Row Video Visit from 02/05/2021 in [facility] Video Visit from 12/03/2020 in  [facility] Video Visit from 10/23/2020 in [facility]  Total GAD-7 Score 19 21 21       PHQ2-9    Flowsheet Row Video Visit from 02/05/2021 in [facility] Video Visit from 12/03/2020 in [facility] Video Visit from 10/23/2020 in [facility] Counselor from 10/15/2020 in [place]  PHQ-2 Total Score 4 4 5 4   PHQ-9 Total Score 18 19 21 20       Flowsheet Row Video Visit from 02/05/2021 in [facility] Video Visit from 12/03/2020 in [facility] Video Visit from 10/23/2020 in [facility]  C-SSRS RISK CATEGORY No Risk Low Risk No Risk        Assessment and Plan:   Jericho Cieslik. Pantaleon is a 45 year old female with a past psychiatric history significant for major depressive disorder, generalized anxiety disorder, insomnia, and panic disorder who presents to [facility] via virtual video visit for follow-up and medication management.  Patient expresses that her depression and anxiety have been much more tolerable since adjusting her dosage of venlafaxine.  Patient denies dosage adjustments regarding her venlafaxine or hydroxyzine.  Patient would like to discontinue taking trazodone due to continuous issues with her sleep.  Patient was recommended mirtazapine 7.5 mg at bedtime for the management of her sleep disturbances and improvements in her depression and anxiety.  Patient was agreeable to recommendation.  Patient's medications to be e-prescribed to pharmacy of choice.  1. Generalized anxiety disorder  - venlafaxine XR (EFFEXOR XR) 75 MG 24 hr capsule; Take 1 capsule (75 mg total) by mouth daily.  Dispense: 30 capsule; Refill: 1 - mirtazapine (REMERON) 7.5 MG tablet; Take 1 tablet (7.5 mg total) by mouth at  bedtime.  Dispense: 30 tablet; Refill: 1  2. Major depressive disorder, recurrent episode, moderate (HCC)  - venlafaxine XR (EFFEXOR XR) 75 MG 24 hr capsule; Take 1 capsule (75 mg total) by mouth daily.  Dispense: 30 capsule; Refill: 1 - mirtazapine (REMERON) 7.5 MG tablet; Take 1 tablet (7.5 mg total) by mouth at bedtime.  Dispense: 30 tablet; Refill: 1  3. PTSD (post-traumatic stress disorder)  - venlafaxine XR (EFFEXOR XR) 75 MG 24 hr capsule; Take 1 capsule (75 mg total) by mouth daily.  Dispense: 30 capsule; Refill: 1  4. Panic disorder  - venlafaxine XR (EFFEXOR XR) 75 MG 24 hr capsule; Take 1 capsule (75 mg total) by mouth daily.  Dispense: 30 capsule; Refill: 1  Patient to follow up in 2 months  Provider spent a total of 20 minutes with the patient/reviewing patient's chart  Meta Hatchet, PA 02/05/2021, 3:39 PM

## 2021-02-17 ENCOUNTER — Telehealth (HOSPITAL_COMMUNITY): Payer: Self-pay | Admitting: *Deleted

## 2021-02-17 NOTE — Telephone Encounter (Signed)
90 day refill request by Rx not patient

## 2021-03-30 ENCOUNTER — Encounter (HOSPITAL_COMMUNITY): Payer: Self-pay | Admitting: Physician Assistant

## 2021-04-03 ENCOUNTER — Other Ambulatory Visit: Payer: Self-pay

## 2021-04-03 ENCOUNTER — Telehealth (HOSPITAL_COMMUNITY): Payer: Medicaid Other | Admitting: Physician Assistant

## 2022-02-18 ENCOUNTER — Other Ambulatory Visit: Payer: Self-pay

## 2022-02-18 DIAGNOSIS — R051 Acute cough: Secondary | ICD-10-CM | POA: Insufficient documentation

## 2022-02-19 ENCOUNTER — Emergency Department (HOSPITAL_BASED_OUTPATIENT_CLINIC_OR_DEPARTMENT_OTHER)
Admission: EM | Admit: 2022-02-19 | Discharge: 2022-02-19 | Disposition: A | Discharge status: Home / Self Care | Payer: Medicaid Other | Attending: Emergency Medicine | Admitting: Emergency Medicine

## 2022-02-19 ENCOUNTER — Encounter (HOSPITAL_BASED_OUTPATIENT_CLINIC_OR_DEPARTMENT_OTHER): Payer: Self-pay

## 2022-02-19 DIAGNOSIS — R051 Acute cough: Secondary | ICD-10-CM

## 2022-02-19 NOTE — ED Provider Notes (Signed)
[facility] EMERGENCY DEPARTMENT Provider Note  CSN: [id number] Arrival date & time: 02/18/22 2357  Chief Complaint(s) Cough  HPI Jillian Ibarra is a 46 y.o. female     Cough Cough characteristics:  Dry Sputum characteristics:  Nondescript Severity:  Moderate Onset quality:  Gradual Duration:  3 days Timing:  Intermittent Progression:  Waxing and waning Chronicity:  New Context: weather changes   Context: not sick contacts and not upper respiratory infection   Relieved by:  Nothing Worsened by:  Nothing Associated symptoms: headaches, rhinorrhea and sinus congestion   Associated symptoms: no chills and no fever     Past Medical History Past Medical History:  Diagnosis Date   Kidney stones    Patient Active Problem List   Diagnosis Date Noted   Insomnia due to other mental disorder 12/03/2020   PTSD (post-traumatic stress disorder) 12/03/2020   Panic disorder 12/03/2020   Major depressive disorder, recurrent episode, moderate (HCC) 10/15/2020   Generalized anxiety disorder 10/15/2020   Home Medication(s) Prior to Admission medications   Medication Sig Start Date End Date Taking? Authorizing Provider  cephALEXin (KEFLEX) 500 MG capsule Take 1 capsule (500 mg total) by mouth 3 (three) times daily. 12/20/18   [name], MD  fluticasone [facility]) 50 MCG/ACT nasal spray Place 2 sprays into both nostrils daily as needed for allergies or rhinitis.    [provider]  HYDROcodone-acetaminophen (NORCO/VICODIN) 5-325 MG tablet Take 1 tablet by mouth every 4 (four) hours as needed for moderate pain. 12/20/18   [name], MD  hydrOXYzine (ATARAX/VISTARIL) 10 MG tablet Take 1 tablet (10 mg total) by mouth 3 (three) times daily as needed for anxiety. 10/23/20   Nwoko, Tommas Olp, PA  mirtazapine (REMERON) 7.5 MG tablet Take 1 tablet (7.5 mg total) by mouth at bedtime. 02/05/21 02/05/22  Nwoko, Tommas Olp, PA  ondansetron (ZOFRAN ODT) 8 MG disintegrating  tablet Take 1 tablet (8 mg total) by mouth every 8 (eight) hours as needed for nausea or vomiting. 12/20/18   [name], MD  venlafaxine XR (EFFEXOR XR) 75 MG 24 hr capsule Take 1 capsule (75 mg total) by mouth daily. 02/05/21 02/05/22  Meta Hatchet, PA                                                                                                                                    Allergies Sulfa antibiotics  Review of Systems Review of Systems  Constitutional:  Negative for chills and fever.  HENT:  Positive for rhinorrhea.   Respiratory:  Positive for cough.   Neurological:  Positive for headaches.   As noted in HPI  Physical Exam Vital Signs  I have reviewed the triage vital signs BP 129/89 (BP Location: Left Arm)   Pulse 84   Temp 98.1 F (36.7 C) (Oral)   Resp 20   Ht 5' (1.524 m)   Wt 65.8 kg  LMP 11/23/2021 (Approximate)   SpO2 99%   BMI 28.32 kg/m   Physical Exam Vitals reviewed.  Constitutional:      General: She is not in acute distress.    Appearance: She is well-developed. She is not diaphoretic.  HENT:     Head: Normocephalic and atraumatic.     Right Ear: Tympanic membrane normal.     Left Ear: Tympanic membrane normal.     Nose: Nose normal.     Mouth/Throat:     Mouth: No oral lesions.     Tongue: No lesions.     Palate: No lesions.     Pharynx: No pharyngeal swelling or posterior oropharyngeal erythema.     Tonsils: No tonsillar exudate or tonsillar abscesses.     Comments: Edentulous  Eyes:     General: No scleral icterus.       Right eye: No discharge.        Left eye: No discharge.     Conjunctiva/sclera: Conjunctivae normal.     Pupils: Pupils are equal, round, and reactive to light.  Cardiovascular:     Rate and Rhythm: Normal rate and regular rhythm.     Heart sounds: No murmur heard.    No friction rub. No gallop.  Pulmonary:     Effort: Pulmonary effort is normal. No respiratory distress.     Breath sounds: Normal breath  sounds. No stridor. No rales.  Abdominal:     General: There is no distension.     Palpations: Abdomen is soft.     Tenderness: There is no abdominal tenderness.  Musculoskeletal:        General: No tenderness.     Cervical back: Normal range of motion and neck supple.  Skin:    General: Skin is warm and dry.     Findings: No erythema or rash.  Neurological:     Mental Status: She is alert and oriented to person, place, and time.     ED Results and Treatments Labs (all labs ordered are listed, but only abnormal results are displayed) Labs Reviewed - No data to display                                                                                                                       EKG  EKG Interpretation  Date/Time:    Ventricular Rate:    PR Interval:    QRS Duration:   QT Interval:    QTC Calculation:   R Axis:     Text Interpretation:         Radiology No results found.  Pertinent labs & imaging results that were available during my care of the patient were reviewed by me and considered in my medical decision making (see MDM for details).  Medications Ordered in ED Medications - No data to display  Procedures Procedures  (including critical care time)  Medical Decision Making / ED Course    Complexity of Problem:  Patient's presenting problem/concern, DDX, and MDM listed below: Cough Allergic vs viral URI No concerning for bacterial process yet. Supportive management recommended.    Final Clinical Impression(s) / ED Diagnoses Final diagnoses:  Acute cough   The patient appears reasonably screened and/or stabilized for discharge and I doubt any other medical condition or other [facility] requiring further screening, evaluation, or treatment in the ED at this time. I have discussed the findings, Dx and Tx plan with the  patient/family who expressed understanding and agree(s) with the plan. Discharge instructions discussed at length. The patient/family was given strict return precautions who verbalized understanding of the instructions. No further questions at time of discharge.  Disposition: Discharge  Condition: Good  ED Discharge Orders     None       Follow Up: Primary care provider  Call  to schedule an appointment for close follow up           This chart was dictated using voice recognition software.  Despite best efforts to proofread,  errors can occur which can change the documentation meaning.    Nira Conn, MD 02/19/22 [phone]

## 2022-02-19 NOTE — ED Triage Notes (Signed)
Pt reports productive cough x1 week and migraine x3 days.

## 2024-05-23 ENCOUNTER — Other Ambulatory Visit: Payer: Self-pay | Admitting: Medical Genetics

## 2024-06-13 ENCOUNTER — Other Ambulatory Visit: Payer: MEDICAID

## 2024-08-23 ENCOUNTER — Ambulatory Visit (INDEPENDENT_AMBULATORY_CARE_PROVIDER_SITE_OTHER): Payer: MEDICAID

## 2024-08-23 ENCOUNTER — Ambulatory Visit (HOSPITAL_BASED_OUTPATIENT_CLINIC_OR_DEPARTMENT_OTHER)
Admission: RE | Admit: 2024-08-23 | Discharge: 2024-08-23 | Disposition: A | Discharge status: Home / Self Care | Payer: MEDICAID | Source: Ambulatory Visit

## 2024-08-23 VITALS — BP 118/80 | Ht 60.0 in | Wt 165.0 lb

## 2024-08-23 DIAGNOSIS — G8929 Other chronic pain: Secondary | ICD-10-CM | POA: Diagnosis not present

## 2024-08-23 DIAGNOSIS — M1711 Unilateral primary osteoarthritis, right knee: Secondary | ICD-10-CM

## 2024-08-23 DIAGNOSIS — M25561 Pain in right knee: Secondary | ICD-10-CM

## 2024-08-23 NOTE — Progress Notes (Signed)
" ° °  Subjective:    Patient ID: Jillian Ibarra, female    DOB: 49 y.o., [birth year]   MRN: [id number]  Chief Complaint: Chronic right knee pain  History of Present Illness Jillian Ibarra is a 49 year old female presenting with pain in her right knee.  Right knee pain - Prior fall down a flight of stairs 15 yrs ago seemingly was the first time she noted pain in her right knee - Intermittent crepitus in both knees - Pain and difficulty with kneeling, climbing stairs, prolonged standing, and rising from a seated position - Occasional swelling of knees, legs, and ankles, last episode about six months ago - Eight-week course of physical therapy for knee and back pain, including use of foot insoles, provided significant improvement - No locking or catching - Nocturnal cramps and restless legs  Review of Pertinent Imaging: None currently available in our system    Objective:   Vitals:   08/23/24 1107  BP: 118/80   Right knee (compared to normal) -Inspection: No swelling, erythema, deformity, or visible effusion. -Palpation: TTP + quad tendon, - patella, - patellar tendon, - tibial tuberosity, - pes bursa, - gerdy tubercle, - medial joint line, - lateral joint line, - posterior knee, - medial and lateral hamstrings.  Moderate crepitus with flexion/extension. -AROM/PROM: 0 degrees extension, 150 degrees flexion, normal hamstring flexibility -Strength: Prominent medial movement of right knee with single leg squat, 5/5 flexion, 5/5 extension -Special tests:    -ACL: - lachman   -MCL: stable and painless with valgus at 0/30 degrees   -LCL: stable and painless with varus at 0/30 degrees   -PCL: - sag sign   -Meniscus: + thessaly, + McMurray   -Patellofemoral: + patellar grind      Assessment & Plan:   Assessment & Plan [name] is a pleasant 49 year old female who has chronic right knee pain worse with kneeling but exhibits modest gluteus medius weakness, positive  McMurray/Thessaly testing, and pain with single-leg squat anteriorly consistent with meniscus irritation/tearing and patellofemoral pain syndrome.  We will obtain x-rays today and follow-up these results regarding informing our partial treatment.  Likely this will involve physical therapy plus either oral anti-inflammatories or intra-articular injection with follow-up in 6 weeks.   "

## 2024-08-24 ENCOUNTER — Ambulatory Visit: Payer: Self-pay

## 2024-08-24 MED ORDER — NAPROXEN 500 MG PO TABS: 500.0000 mg | ORAL_TABLET | Freq: Two times a day (BID) | ORAL | 1 refills | Status: AC

## 2024-08-24 NOTE — Addendum Note (Signed)
 Addended by: CHARLES ROGUE A on: 08/24/2024 05:23 PM   Modules accepted: Orders
